# Patient Record
Sex: Female | Born: 1965 | Race: White | Hispanic: No | Marital: Single | Smoking: Never smoker
Health system: Southern US, Community
[De-identification: ages and names within clinical notes are randomized; demographics above are authoritative.]

## PROBLEM LIST (undated history)

## (undated) DIAGNOSIS — M545 Low back pain, unspecified: Secondary | ICD-10-CM

## (undated) DIAGNOSIS — F419 Anxiety disorder, unspecified: Secondary | ICD-10-CM

## (undated) DIAGNOSIS — E039 Hypothyroidism, unspecified: Secondary | ICD-10-CM

---

## 1998-06-28 ENCOUNTER — Ambulatory Visit (HOSPITAL_COMMUNITY): Admission: RE | Admit: 1998-06-28 | Discharge: 1998-06-28 | Payer: Self-pay | Admitting: Obstetrics and Gynecology

## 2004-04-20 ENCOUNTER — Ambulatory Visit (HOSPITAL_COMMUNITY): Admission: RE | Admit: 2004-04-20 | Discharge: 2004-04-20 | Payer: Self-pay | Admitting: Gastroenterology

## 2004-05-21 ENCOUNTER — Encounter (INDEPENDENT_AMBULATORY_CARE_PROVIDER_SITE_OTHER): Payer: Self-pay | Admitting: Specialist

## 2004-05-21 ENCOUNTER — Ambulatory Visit (HOSPITAL_COMMUNITY): Admission: RE | Admit: 2004-05-21 | Discharge: 2004-05-22 | Payer: Self-pay | Admitting: General Surgery

## 2005-08-05 ENCOUNTER — Encounter (INDEPENDENT_AMBULATORY_CARE_PROVIDER_SITE_OTHER): Payer: Self-pay | Admitting: *Deleted

## 2005-08-05 ENCOUNTER — Ambulatory Visit (HOSPITAL_BASED_OUTPATIENT_CLINIC_OR_DEPARTMENT_OTHER): Admission: RE | Admit: 2005-08-05 | Discharge: 2005-08-05 | Payer: Self-pay | Admitting: Obstetrics and Gynecology

## 2005-09-17 IMAGING — RF DG CHOLANGIOGRAM OPERATIVE
1 series · 4 of 4 positions shown · IV contrast (agent unspecified)
Comparison: none

CLINICAL DATA: Laparoscopic cholecystectomy.
 OPERATIVE CHOLANGIOGRAM:
 Multiple C-arm exposures show cannulation of the cystic duct remnant with contrast injection that opacifies in normal proximal intrahepatic biliary system, common hepatic duct, and common bile duct.  No filling defects or obstruction.  Contrast flows freely into the duodenum.

[Series 1: run · 4 of 79 frames shown]
[frame 12/79]
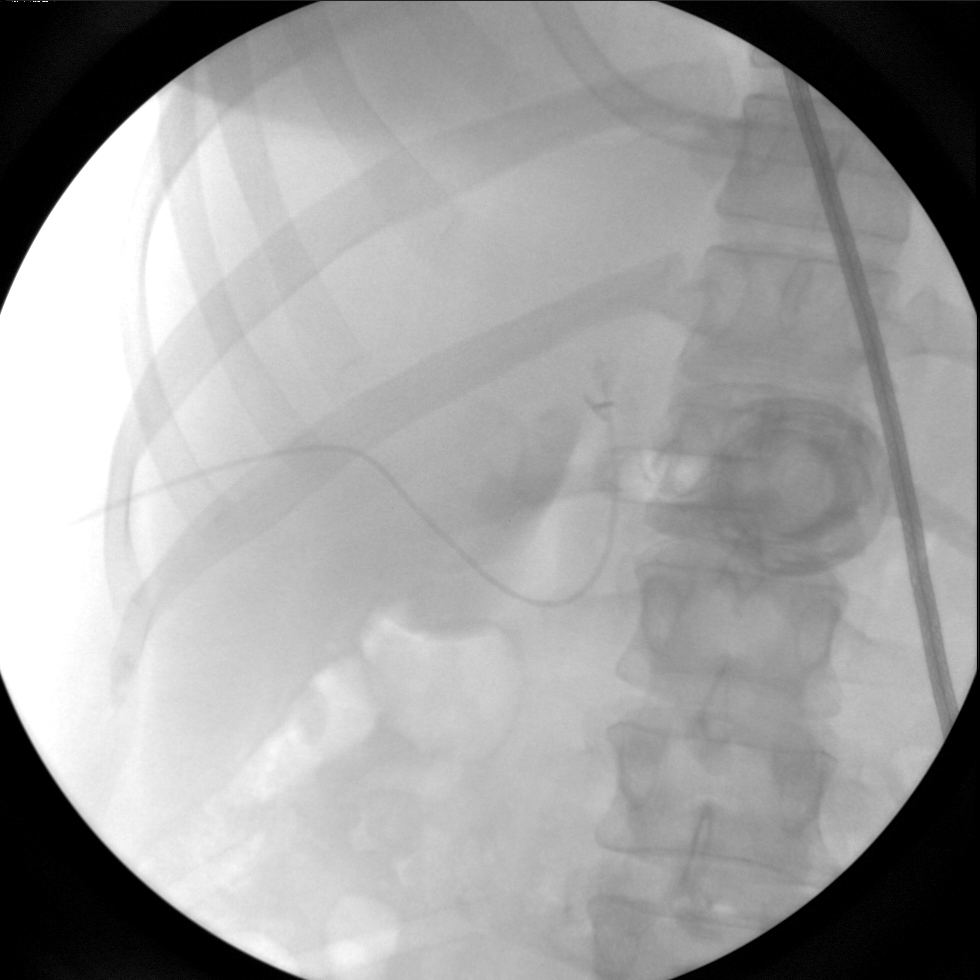
[frame 40/79]
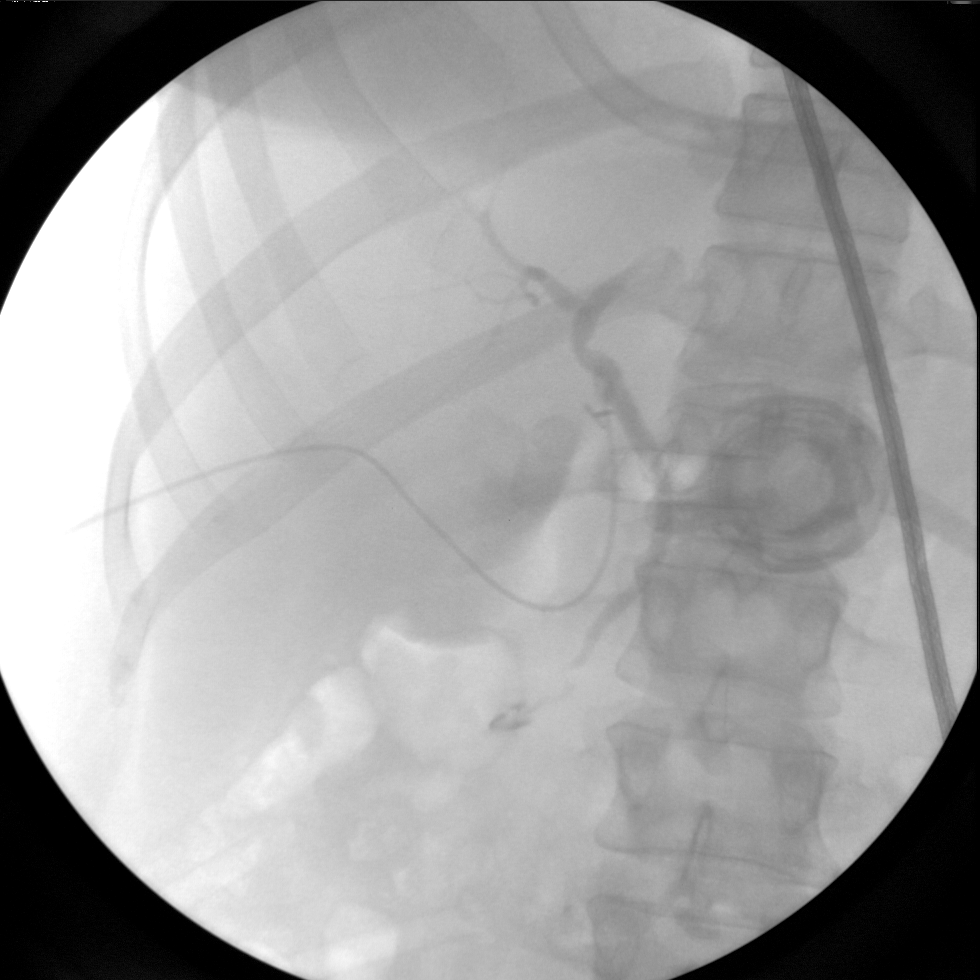
[frame 68/79]
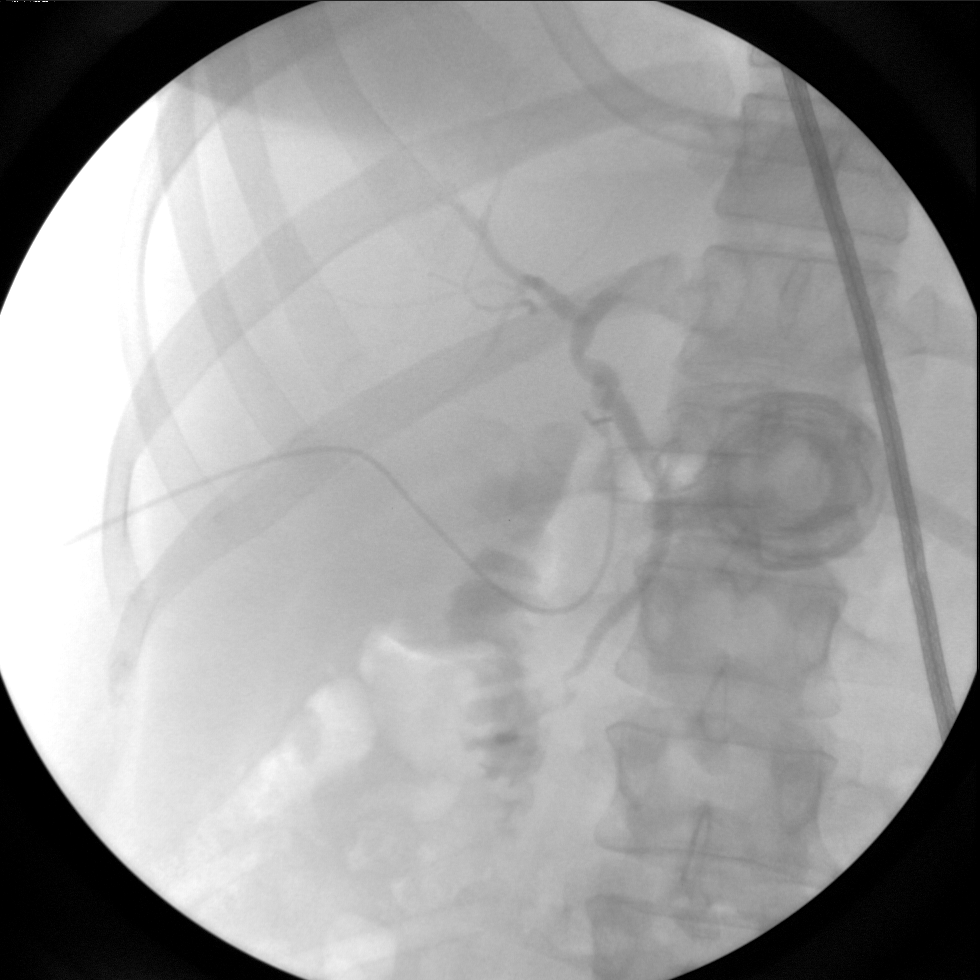
[frame 79/79]
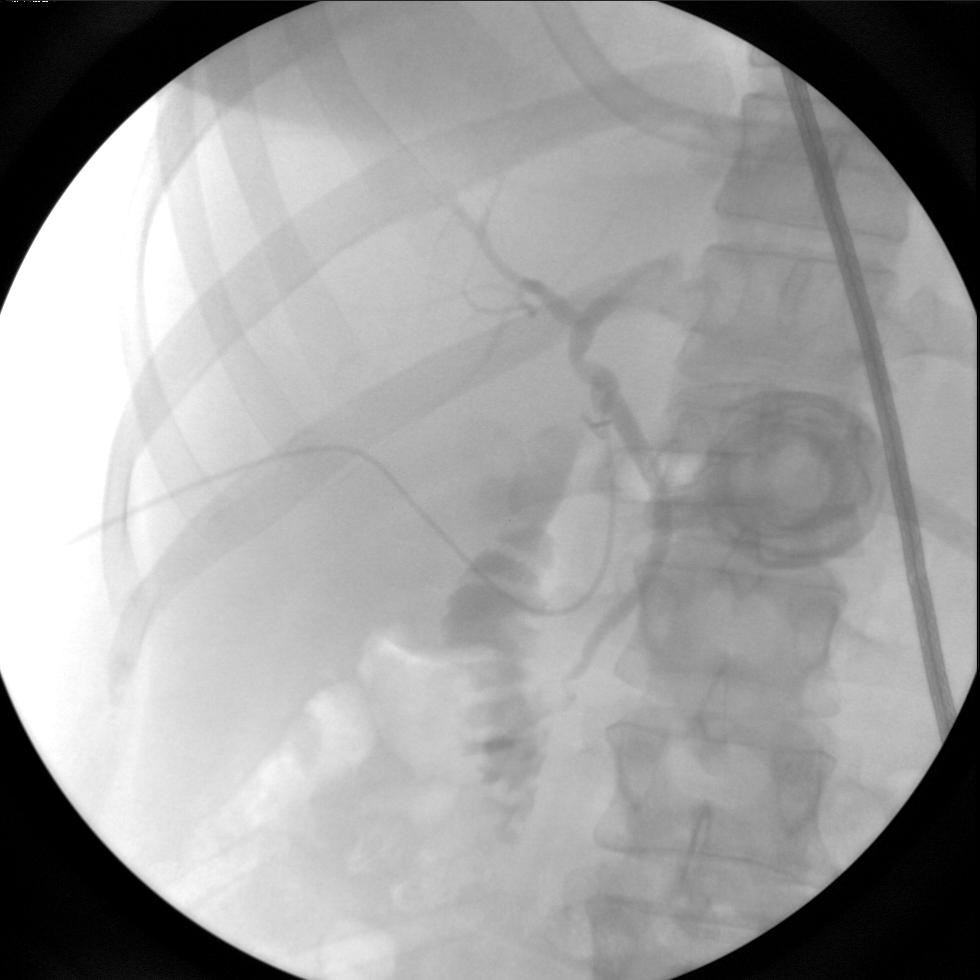

[4 of 4 positions shown; findings below may reference images not displayed]

IMPRESSION: Normal operative cholangiogram.

## 2010-02-24 ENCOUNTER — Emergency Department (HOSPITAL_COMMUNITY): Admission: EM | Admit: 2010-02-24 | Discharge: 2010-02-24 | Payer: Self-pay | Admitting: Emergency Medicine

## 2010-10-05 LAB — DIFFERENTIAL
Basophils Absolute: 0.1 10*3/uL (ref 0.0–0.1)
Basophils Relative: 1 % (ref 0–1)
Eosinophils Absolute: 0.4 10*3/uL (ref 0.0–0.7)
Eosinophils Relative: 4 % (ref 0–5)
Lymphocytes Relative: 30 % (ref 12–46)
Lymphs Abs: 2.8 10*3/uL (ref 0.7–4.0)
Monocytes Absolute: 0.8 10*3/uL (ref 0.1–1.0)
Monocytes Relative: 8 % (ref 3–12)
Neutro Abs: 5.3 10*3/uL (ref 1.7–7.7)
Neutrophils Relative %: 57 % (ref 43–77)

## 2010-10-05 LAB — CBC
HCT: 37 % (ref 36.0–46.0)
Hemoglobin: 12.4 g/dL (ref 12.0–15.0)
MCH: 27.4 pg (ref 26.0–34.0)
MCHC: 33.5 g/dL (ref 30.0–36.0)
MCV: 81.9 fL (ref 78.0–100.0)
Platelets: 240 10*3/uL (ref 150–400)
RBC: 4.52 MIL/uL (ref 3.87–5.11)
RDW: 15.3 % (ref 11.5–15.5)
WBC: 9.3 10*3/uL (ref 4.0–10.5)

## 2010-10-05 LAB — POCT I-STAT, CHEM 8
BUN: 13 mg/dL (ref 6–23)
Calcium, Ion: 1.13 mmol/L (ref 1.12–1.32)
Chloride: 104 mEq/L (ref 96–112)
Creatinine, Ser: 1.1 mg/dL (ref 0.4–1.2)
Glucose, Bld: 84 mg/dL (ref 70–99)
HCT: 38 % (ref 36.0–46.0)
Hemoglobin: 12.9 g/dL (ref 12.0–15.0)
Potassium: 3.9 mEq/L (ref 3.5–5.1)
Sodium: 139 mEq/L (ref 135–145)
TCO2: 26 mmol/L (ref 0–100)

## 2010-10-05 LAB — BASIC METABOLIC PANEL
BUN: 12 mg/dL (ref 6–23)
CO2: 27 mEq/L (ref 19–32)
Calcium: 9.4 mg/dL (ref 8.4–10.5)
Chloride: 106 mEq/L (ref 96–112)
Creatinine, Ser: 0.94 mg/dL (ref 0.4–1.2)
GFR calc Af Amer: >60 mL/min (ref >60)
GFR calc non Af Amer: >60 mL/min (ref >60)
Glucose, Bld: 85 mg/dL (ref 70–99)
Potassium: 3.9 mEq/L (ref 3.5–5.1)
Sodium: 140 mEq/L (ref 135–145)

## 2010-10-05 LAB — POCT CARDIAC MARKERS
CKMB, poc: 1.1 ng/mL (ref 1.0–8.0)
Myoglobin, poc: 85.6 ng/mL (ref 12–200)
Troponin i, poc: <0.05 ng/mL (ref 0.00–0.09)

## 2010-12-07 NOTE — Op Note (Signed)
Claudia Andrade, Claudia Andrade                  ACCOUNT NO.:  [id number]   MEDICAL RECORD NO.:  [id number]          PATIENT TYPE:  AMB   LOCATION:  [facility]                         FACILITY:  [facility]   PHYSICIAN:  Cynthia P. Romine, M.D.DATE OF BIRTH:  1965-11-01   DATE OF PROCEDURE:  08/05/2005  DATE OF DISCHARGE:                                 OPERATIVE REPORT   PREOPERATIVE DIAGNOSIS:  3 cm vaginal inclusion cyst.   POSTOPERATIVE DIAGNOSIS:  3 cm vaginal inclusion cyst. Path pending,  possible fibroid.   SURGEON:  Cynthia P. Romine, M.D.   ANESTHESIA:  General by LMA.   ESTIMATED BLOOD LOSS:  100 mL.   COMPLICATIONS:  None.   DESCRIPTION OF PROCEDURE:  The patient was taken to the operating room and  after the induction of adequate general anesthesia was prepped and draped in  the usual fashion. The vaginal mucosa over this 3 cm mass was incised with a  knife. This mass was directly on the posterior wall at the introitus and was  very mobile. It was definitely separate from the rectum and it would pull  almost out of the introitus so that for the dissection, the surgeon could  operate essentially outside the introitus. After incising the vaginal  mucosa, the vaginal mucosa was grasped with Kelly's and sharp dissection was  used to shell out this cyst. It shelled out very much the way a fibroid  shells out of the uterus. It was removed and it was opened and the body of  the cyst was firm and white, although it did not whorled. The base of the  wound where the cyst had been was closed with 2-0 chromic for hemostasis.  The vaginal mucosa was then reapproximated also with 2-0 chromic. Good  hemostasis was achieved and the procedure was terminated. The patient was  taken to the recovery room in satisfactory condition.           ______________________________  Edwena Felty. Romine, M.D.     CPR/MEDQ  D:  08/05/2005  T:  08/05/2005  Job:  [id number]

## 2010-12-07 NOTE — Op Note (Signed)
Claudia Andrade, Claudia Andrade                  ACCOUNT NO.:  [id number]   MEDICAL RECORD NO.:  [id number]          PATIENT TYPE:  OIB   LOCATION:  5703                         FACILITY:  [facility]   PHYSICIAN:  Adolph Pollack, M.D.DATE OF BIRTH:  05-Dec-1965   DATE OF PROCEDURE:  05/21/2004  DATE OF DISCHARGE:                                 OPERATIVE REPORT   PREOPERATIVE DIAGNOSIS:  Symptomatic cholelithiasis.   POSTOPERATIVE DIAGNOSIS:  Chronic cholecystitis with cholelithiasis.   PROCEDURE:  Laparoscopic cholecystectomy with interoperative cholangiogram.   SURGEON:  Adolph Pollack, M.D.   ASSISTANT:  Leonie Man, M.D.   ANESTHESIA:  General.   INDICATIONS FOR PROCEDURE:  Ms. Balin is a 45 year old female who has been  having some cramping type right upper quadrant pain associated with nausea  following a meal.  She had two other episodes since that time.  Ultrasound  demonstrated cholelithiasis and multiple gallstones.  The common bile duct  diameter is normal.  Liver function is normal.  She now presents for  elective cholecystectomy.   SURGICAL TECHNIQUE:  She was seen in the holding area and then brought to  the operating room and placed supine on the operating table and a general  anesthetic was administered.  Her abdominal wall was sterilely prepped and  draped.  Dilute Marcaine solution was infiltrated into the subumbilical  region and a small subumbilical incision was made through the skin and  subcutaneous tissue, midline fascia, and peritoneum entering the peritoneal  cavity under direct vision.  A purse-string suture of 0 Vicryl was placed  around the fascial edges.  A Hasson trocar was introduced into the  peritoneal cavity and pneumoperitoneum was created by insufflation of CO2  gas.  The laparoscope was then introduced.  She was then placed in reversed  Trendelenburg position with the right side tilted slightly up.  Under direct  vision, an 11 mm trocar was placed  through an epigastric incision and two 5  mm trocars were placed in the right and mid lateral abdomen.  The  gallbladder was slightly pale in color consistent with some chronic  inflammatory changes.  The fundus of the gallbladder was grasped and  retracted to the right shoulder.  The infundibulum was grasped and using  careful blunt dissection staying on the gallbladder, I mobilized the  infundibulum.  I then identified the cystic duct and the cystic duct  gallbladder junction.  The cystic duct was isolated and a window was created  around it.  A clip was then placed just above the cystic duct gallbladder  junction.  I made a small incision at the cystic duct gallbladder junction  and bile was milked back.  A cholangiogram catheter was passed through the  anterior abdominal wall and placed in the cystic duct and cholangiogram was  performed.  Under real time fluoroscopy, dilute contrast material was  injected into the cystic duct.  The cystic duct was a short to moderate  length.  The common hepatic, right and left hepatic, and common bile ducts  all opacified promptly and contrast drained into the  common bile duct  promptly without obvious evidence of obstruction.  The final report is  pending the radiologist interpretation.  The cholangiogram catheter was then  removed, the cystic duct was clipped three proximally and divided.  Staying  close to the gallbladder, I isolated both an anterior and posterior branch  of the cystic artery, clipped these, and divided them.  The gallbladder was  dissected free from the liver bed using electrocautery staying on the  gallbladder.  A small puncture wound was made in the gallbladder and some  bile spilled out but no obvious stones were spilled.  The gallbladder was  then placed in the endopouch bag.  I then copiously irrigated out the  gallbladder fossa in the perihepatic area.  Bleeding points were controlled  with cautery.  The fluid was then  evacuated.  The gallbladder fossa was  inspected once again and no bile leak or bleeding was noted.  I then  evacuated the rest of the irrigation fluid and irrigated until it was clear.  Once it was clear, I then removed the gallbladder through the subumbilical  port in the endopouch bag.  I then subumbilical fascial defect under  laparoscopic vision by tightening up and tying down the purse-string suture.  The remaining trocars were removed and the pneumoperitoneum was released.  The skin incisions were closed with 4-0 Monocryl subcuticular stitches  followed by Steri-Strips and sterile dressings.  She tolerated the procedure  well without any apparent complication and was taken to the recovery room in  satisfactory condition.       TJR/MEDQ  D:  05/21/2004  T:  05/21/2004  Job:  [id number]   cc:   Anselmo Rod, M.D.  [address]  [address]  [place]  [place] [address]  Fax: [phone]   Gloriajean Dell. Andrey Campanile, M.D.  [address]  [place]  [place] [address]  Fax: [phone]

## 2011-06-23 IMAGING — CR DG CHEST 2V
2 series · 2 of 2 positions shown · non-contrast
Comparison: None.

CLINICAL DATA: 44-year-old female with substernal chest pain.

CHEST - 2 VIEW

[w chest pa *]
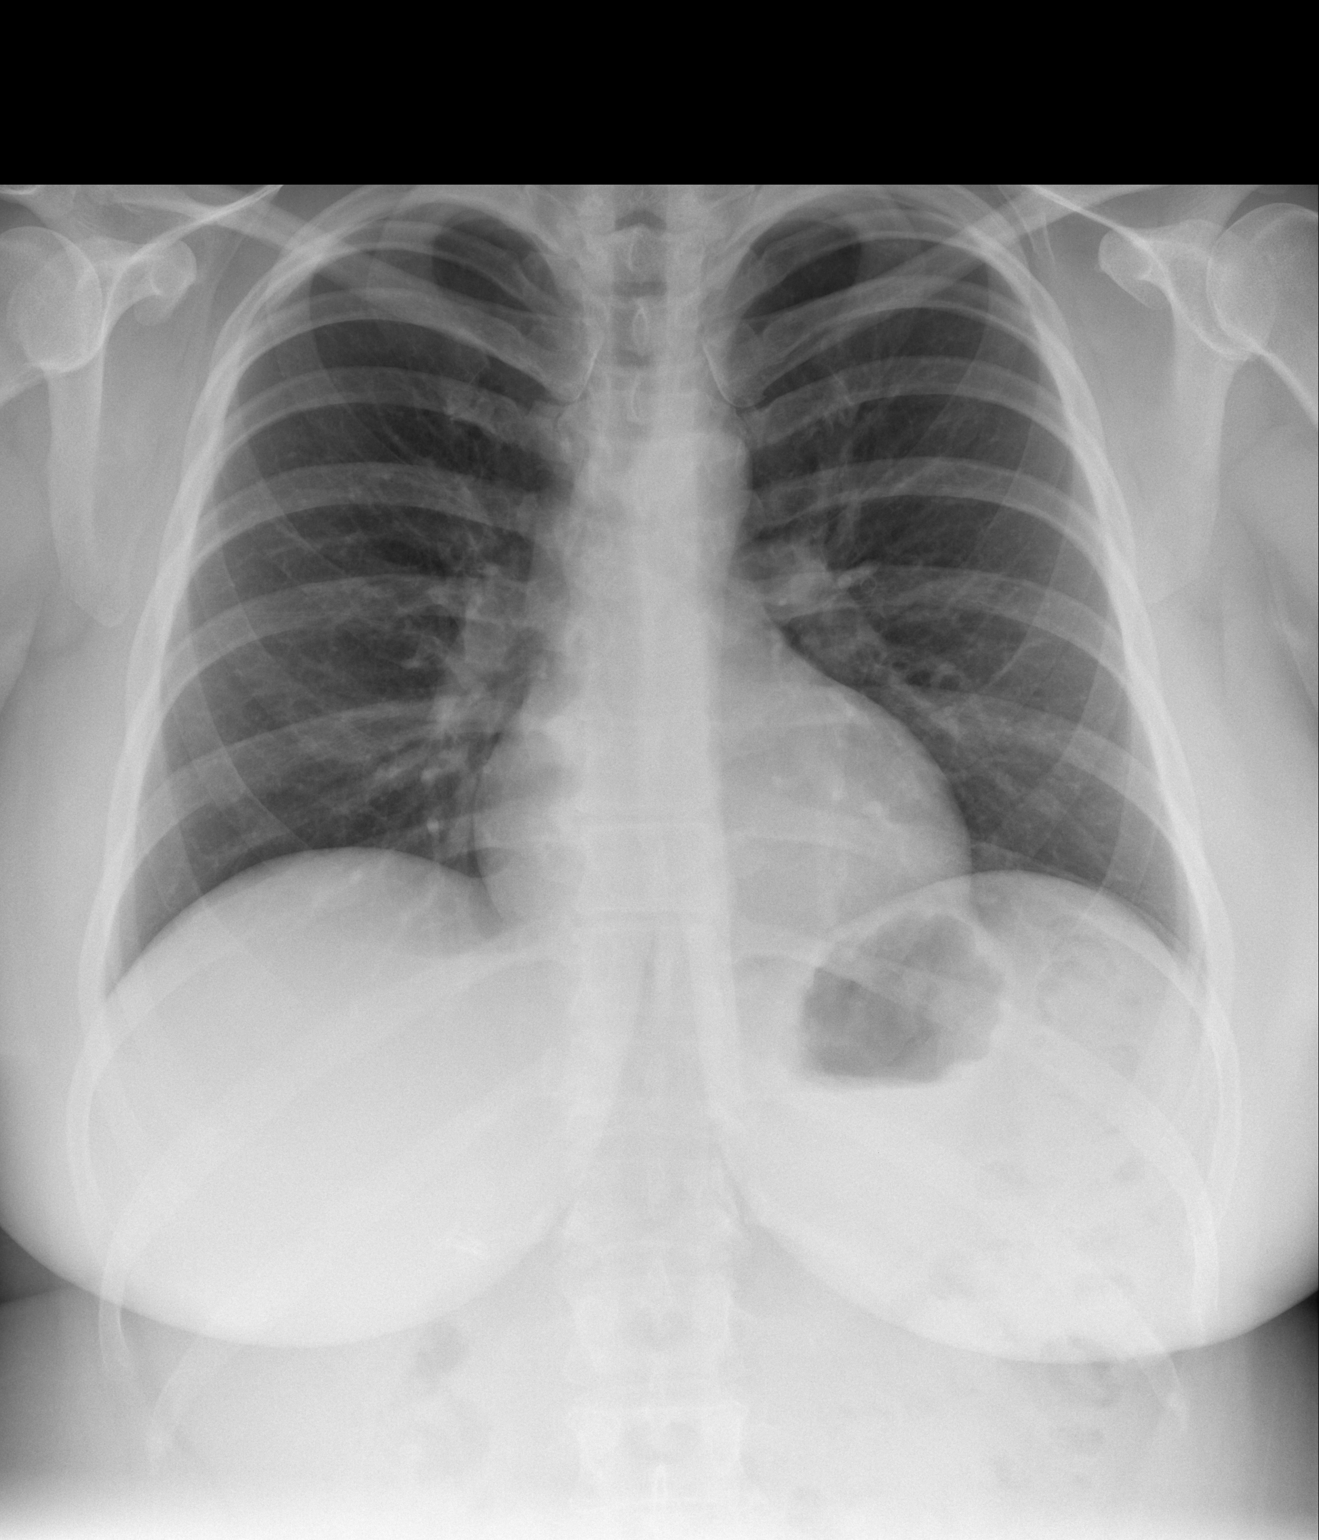

[w chest lat *]
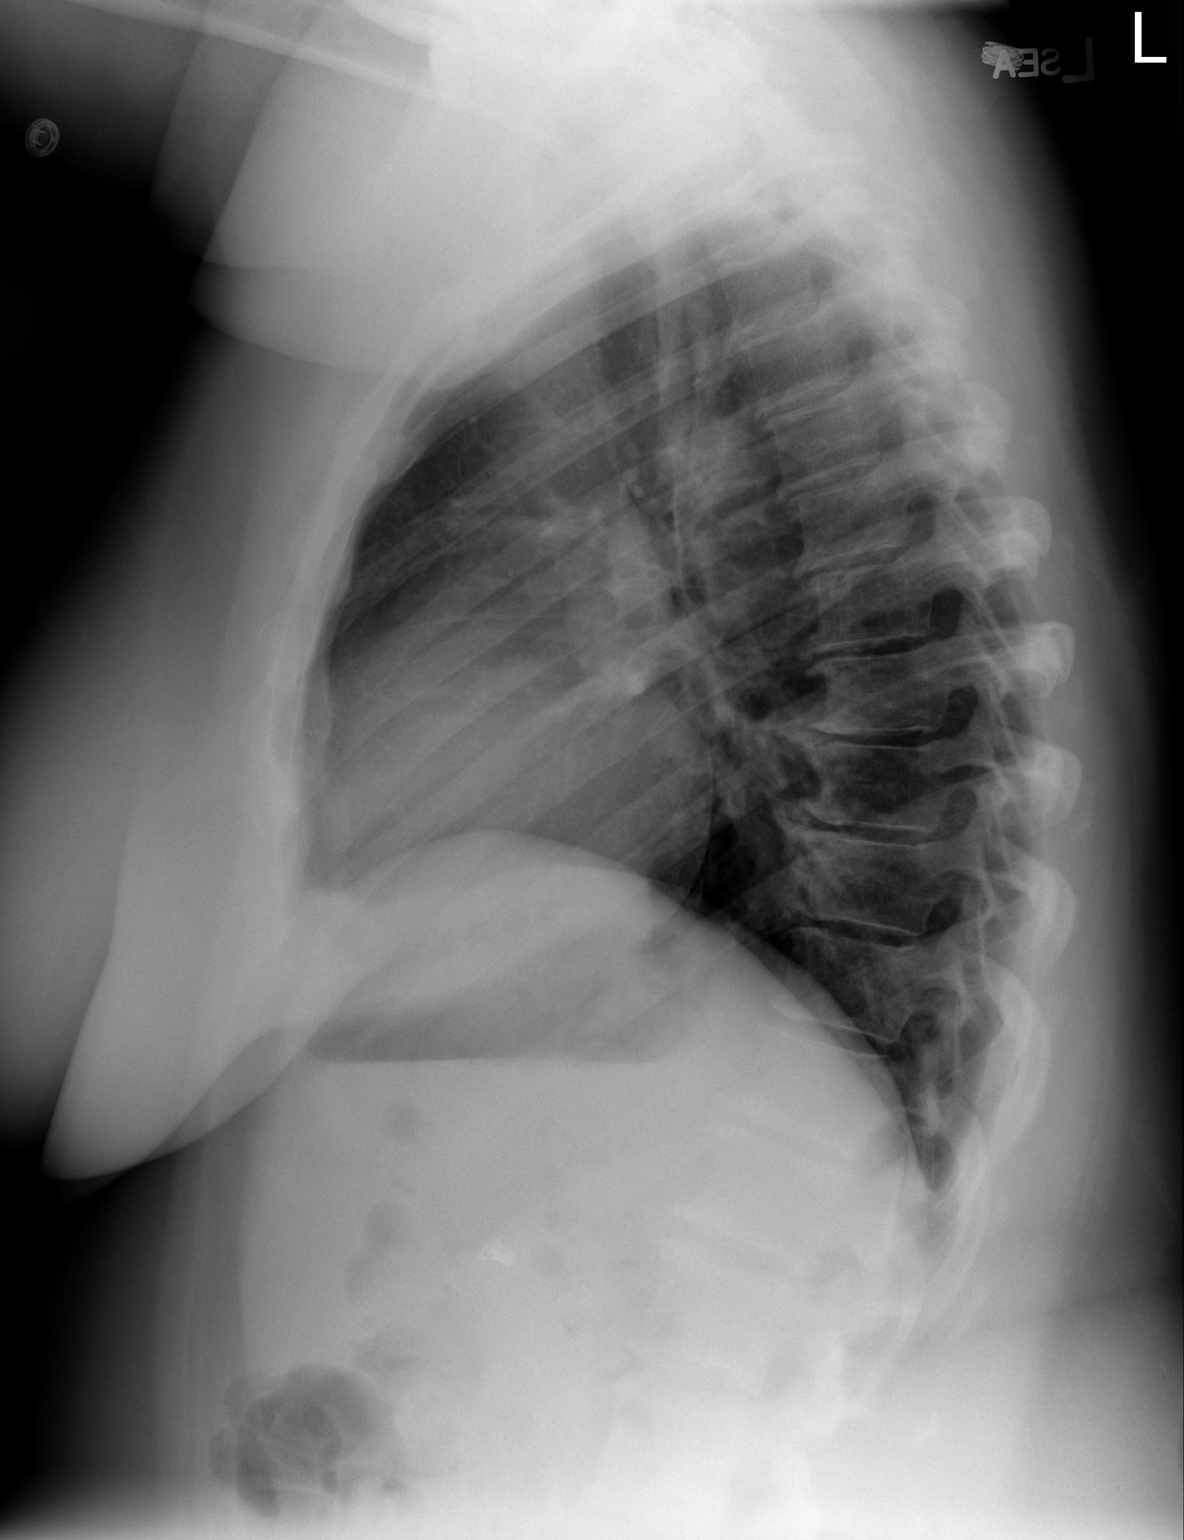

[2 of 2 positions shown; findings below may reference images not displayed]

FINDINGS: Low lung volumes. Normal cardiac size and mediastinal
contours.  Visualized tracheal air column is within normal limits.
The lungs are clear.  No pneumothorax.  Surgical clips in the right
upper quadrant. No acute osseous abnormality identified.
IMPRESSION: Negative, no acute cardiopulmonary abnormality.

## 2013-11-14 ENCOUNTER — Encounter (HOSPITAL_BASED_OUTPATIENT_CLINIC_OR_DEPARTMENT_OTHER): Payer: Self-pay | Admitting: Emergency Medicine

## 2013-11-14 ENCOUNTER — Emergency Department (HOSPITAL_BASED_OUTPATIENT_CLINIC_OR_DEPARTMENT_OTHER)
Admission: EM | Admit: 2013-11-14 | Discharge: 2013-11-14 | Disposition: A | Discharge status: Home / Self Care | Payer: Managed Care, Other (non HMO) | Attending: Emergency Medicine | Admitting: Emergency Medicine

## 2013-11-14 DIAGNOSIS — E039 Hypothyroidism, unspecified: Secondary | ICD-10-CM | POA: Insufficient documentation

## 2013-11-14 DIAGNOSIS — G5600 Carpal tunnel syndrome, unspecified upper limb: Secondary | ICD-10-CM | POA: Insufficient documentation

## 2013-11-14 DIAGNOSIS — Z79899 Other long term (current) drug therapy: Secondary | ICD-10-CM | POA: Insufficient documentation

## 2013-11-14 DIAGNOSIS — Z8739 Personal history of other diseases of the musculoskeletal system and connective tissue: Secondary | ICD-10-CM | POA: Insufficient documentation

## 2013-11-14 DIAGNOSIS — Z791 Long term (current) use of non-steroidal anti-inflammatories (NSAID): Secondary | ICD-10-CM | POA: Insufficient documentation

## 2013-11-14 DIAGNOSIS — F411 Generalized anxiety disorder: Secondary | ICD-10-CM | POA: Insufficient documentation

## 2013-11-14 DIAGNOSIS — N644 Mastodynia: Secondary | ICD-10-CM | POA: Insufficient documentation

## 2013-11-14 HISTORY — DX: Anxiety disorder, unspecified: F41.9

## 2013-11-14 HISTORY — DX: Hypothyroidism, unspecified: E03.9

## 2013-11-14 HISTORY — DX: Low back pain, unspecified: M54.50

## 2013-11-14 HISTORY — DX: Low back pain: M54.5

## 2013-11-14 NOTE — ED Provider Notes (Signed)
CSN: [id number]     Arrival date & time 11/14/13  0825 History   First MD Initiated Contact with Patient 11/14/13 [id number]     Chief Complaint  Patient presents with  . Numbness  . Breast Pain     HPI  Presents with tingling in her left hand and forearm. She's noticed it for about a week. It is a tingling described as pins and needles. He does breath am, index, middle finger. 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 There is a consistently for the last 3 days. States at times will make her feel a tingling into her anterior axillary fold but not into her chest. He usually gets back pain. Follows with her primary care physician for this and been told it is muscular. She has no cardiac risk factors. No history of hypertension diabetes or smoking. No chest pain no difficulty breathing nausea or other symptoms.  Past Medical History  Diagnosis Date  . Hypothyroid   . Anxiety   . Lower back pain    No past surgical history on file. No family history on file. History  Substance Use Topics  . Smoking status: Never Smoker   . Smokeless tobacco: Not on file  . Alcohol Use: Not on file   OB History   Grav Para Term Preterm Abortions TAB SAB Ect Mult Living                 Review of Systems  Constitutional: Negative for fever, chills, diaphoresis, appetite change and fatigue.  HENT: Negative for mouth sores, sore throat and trouble swallowing.   Eyes: Negative for visual disturbance.  Respiratory: Negative for cough, chest tightness, shortness of breath and wheezing.   Cardiovascular: Negative for chest pain.  Gastrointestinal: Negative for nausea, vomiting, abdominal pain, diarrhea and abdominal distention.  Endocrine: Negative for polydipsia, polyphagia and polyuria.  Genitourinary: Negative for dysuria, frequency and hematuria.  Musculoskeletal: Negative for gait problem.       Tingling from  the first 3 digits to the mid forearm  Skin: Negative for color change, pallor and rash.  Neurological: Negative for dizziness, syncope, light-headedness and headaches.  Hematological: Does not bruise/bleed easily.  Psychiatric/Behavioral: Negative for behavioral problems and confusion.      Allergies  Review of patient's allergies indicates no known allergies.  Home Medications   Prior to Admission medications   Medication Sig Start Date End Date Taking? Authorizing Provider  citalopram (CELEXA) 40 MG tablet Take 40 mg by mouth daily.   Yes Historical Provider, MD  levothyroxine (SYNTHROID, LEVOTHROID) 25 MCG tablet Take 25 mcg by mouth daily before breakfast.   Yes Historical Provider, MD  meloxicam (MOBIC) 15 MG tablet Take 15 mg by mouth daily.   Yes Historical Provider, MD   BP 128/61  Pulse 64  Temp(Src) 98.1 F (36.7 C) (Oral)  Resp 16  Wt 200 lb (90.719 kg)  SpO2 99% Physical Exam  Constitutional: She is oriented to person, place, and time. She appears well-developed and well-nourished. No distress.  HENT:  Head: Normocephalic.  Eyes: Conjunctivae are normal. Pupils are equal, round, and reactive to light. No scleral icterus.  Neck: Normal range of motion. Neck supple. No thyromegaly present.  Cardiovascular: Normal rate and regular rhythm.  Exam reveals no gallop and no friction rub.   No murmur heard. Pulmonary/Chest: Effort normal and breath sounds normal. No respiratory distress. She  has no wheezes. She has no rales.  Abdominal: Soft. Bowel sounds are normal. She exhibits no distension. There is no tenderness. There is no rebound.  Musculoskeletal: Normal range of motion.       Arms: Neurological: She is alert and oriented to person, place, and time.  Skin: Skin is warm and dry. No rash noted.  Psychiatric: She has a normal mood and affect. Her behavior is normal.    ED Course  Procedures (including critical care time) Labs Review Labs Reviewed - No data to  display  Imaging Review No results found.   EKG Interpretation None      MDM   Final diagnoses:  Carpal tunnel syndrome    EKG. Symptoms do not sound cardiac whatsoever. She does repetitive motion with take occurring once a week does have symptoms after work or in the morning. Normal neurological exam. No symptoms to suggest radiculopathy. No neck pain. I will be rest splinting continuing anti-inflammatory.    Rolland PorterMark Loanne Emery, MD 11/14/13 1020

## 2013-11-14 NOTE — ED Notes (Addendum)
Pt states she started to have left arm numbness/tingling since Friday mid morning.  No known injury.  Pt states she has had intermittent pain between her shoulder blades.  No sob, no chest pain, no neuro deficits noted.  Pt having left upper breast pain since Friday.  Some diaphoresis for 6 months.

## 2013-11-14 NOTE — Discharge Instructions (Signed)
Wear the splint at night. Or your splint at work if possible. Do your anti-inflammatory. Apply ice to your wrist or area of tenderness as needed  Carpal Tunnel Syndrome The carpal tunnel is an area under the skin of the palm of your hand. Nerves, blood vessels, and strong tissues (tendons) pass through the tunnel. The tunnel can become puffy (swollen). If this happens, a nerve can be pinched in the wrist. This causes carpal tunnel syndrome.  HOME CARE  Take all medicine as told by your doctor.  If you were given a splint, wear it as told. Wear it at night or at times when your doctor told you to.  Rest your wrist from the activity that causes your pain.  Put ice on your wrist after long periods of wrist activity.  Put ice in a plastic bag.  Place a towel between your skin and the bag.  Leave the ice on for 15-20 minutes, 03-04 times a day.  Keep all doctor visits as told. GET HELP RIGHT AWAY IF:  You have new problems you cannot explain.  Your problems get worse and medicine does not help. MAKE SURE YOU:   Understand these instructions.  Will watch your condition.  Will get help right away if you are not doing well or get worse. Document Released: 06/27/2011 Document Revised: 09/30/2011 Document Reviewed: 06/27/2011 [facility] Patient Information 2014 [place], [place].

## 2014-07-19 ENCOUNTER — Ambulatory Visit (INDEPENDENT_AMBULATORY_CARE_PROVIDER_SITE_OTHER): Payer: Managed Care, Other (non HMO)

## 2014-07-19 VITALS — BP 110/70 | HR 79 | Resp 12

## 2014-07-19 DIAGNOSIS — M7730 Calcaneal spur, unspecified foot: Secondary | ICD-10-CM

## 2014-07-19 DIAGNOSIS — R52 Pain, unspecified: Secondary | ICD-10-CM

## 2014-07-19 DIAGNOSIS — M7662 Achilles tendinitis, left leg: Secondary | ICD-10-CM

## 2014-07-19 DIAGNOSIS — M79673 Pain in unspecified foot: Secondary | ICD-10-CM

## 2014-07-19 DIAGNOSIS — M7752 Other enthesopathy of left foot: Secondary | ICD-10-CM

## 2014-07-19 NOTE — Progress Notes (Signed)
   Subjective:    Patient ID: Claudia Andrade, female    DOB: 10/10/1965, 48 y.o.   MRN: [id number]  HPI  PT STATED SEEN DR. TOM 6 MONTHS AGO AND DIAGNOSED WITH HEEL SPUR AND BEEN HURTING FOR 1 YEARS. THE FOOT IS GETTING WORSE BUT SOME DAYS IS BETTER. THE FOOT GET WORSE AND HAVE SHARP PAIN. DR. Elijah BirkM PRESCRIBE CORTISONE SHOT BUT NO HELP.  Review of Systems  Cardiovascular:       Calf pain.  Musculoskeletal: Positive for myalgias, back pain, joint swelling and gait problem.  Neurological: Positive for numbness and headaches.  All other systems reviewed and are negative.      Objective:   Physical Exam 48 year old white female well-developed well-nourished oriented 3 presents this time a referral from Dr. Elijah Birkom is retrocalcaneal spurring and thickening the Achilles tendon insertion which is significant as compared to the right left heel has prominence of the posterior heel posterior Achilles tendon insertion and above the insertion tenderness on palpation range of motion and ambulation. X-rays reveal calcification within the Achilles tendon thickening the tendon body at its insertion more than twice its normal thickness there is well-developed inferior as well as significant retrocalcaneal spurs and osteophytes noted radiographically and clinically large edema and induration of the posterior heel area. Patient's had injections in the mobilization air fracture boot NSAID therapies stretching exercises ice all providing little or temporary relief. 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 Plan at this time is for review of consent form for retrocalcaneal spur resection into the lysis Achilles tendon left consent form was reviewed and signed with discontinue NSAIDs release Tylenol as needed for pain ice patient will be in air fracture boot for at least a 8-10 week duration needs a leaf or leave of absence from work for 10-12 weeks postoperatively. Will be immobilized or nonweightbearing for at least 6 weeks cane use either a walker crutches or rollabout knee walker for ambulation and transport. All questions asked by the patient are answered there no current medications and surgery scheduled at this time are     Alvan Dameichard Antione Obar DPM

## 2014-07-19 NOTE — Patient Instructions (Signed)
Pre-Operative Instructions  Congratulations, you have decided to take an important step to improving your quality of life.  You can be assured that the doctors of [facility] will be with you every step of the way.  1. Plan to be at the surgery center/hospital at least 1 (one) hour prior to your scheduled time unless otherwise directed by the surgical center/hospital staff.  You must have a responsible adult accompany you, remain during the surgery and drive you home.  Make sure you have directions to the surgical center/hospital and know how to get there on time. 2. For hospital based surgery you will need to obtain a history and physical form from your family physician within 1 month prior to the date of surgery- we will give you a form for you primary physician.  3. We make every effort to accommodate the date you request for surgery.  There are however, times where surgery dates or times have to be moved.  We will contact you as soon as possible if a change in schedule is required.   4. No Aspirin/Ibuprofen for one week before surgery.  If you are on aspirin, any non-steroidal anti-inflammatory medications (Mobic, Aleve, Ibuprofen) you should stop taking it 7 days prior to your surgery.  You make take Tylenol  For pain prior to surgery.  5. Medications- If you are taking daily heart and blood pressure medications, seizure, reflux, allergy, asthma, anxiety, pain or diabetes medications, make sure the surgery center/hospital is aware before the day of surgery so they may notify you which medications to take or avoid the day of surgery. 6. No food or drink after midnight the night before surgery unless directed otherwise by surgical center/hospital staff. 7. No alcoholic beverages 24 hours prior to surgery.  No smoking 24 hours prior to or 24 hours after surgery. 8. Wear loose pants or shorts- loose enough to fit over bandages, boots, and casts. 9. No slip on shoes, sneakers are best. 10. Bring  your boot with you to the surgery center/hospital.  Also bring crutches or a walker if your physician has prescribed it for you.  If you do not have this equipment, it will be provided for you after surgery. 11. If you have not been contracted by the surgery center/hospital by the day before your surgery, call to confirm the date and time of your surgery. 12. Leave-time from work may vary depending on the type of surgery you have.  Appropriate arrangements should be made prior to surgery with your employer. 13. Prescriptions will be provided immediately following surgery by your doctor.  Have these filled as soon as possible after surgery and take the medication as directed. 14. Remove nail polish on the operative foot. 15. Wash the night before surgery.  The night before surgery wash the foot and leg well with the antibacterial soap provided and water paying special attention to beneath the toenails and in between the toes.  Rinse thoroughly with water and dry well with a towel.  Perform this wash unless told not to do so by your physician.  Enclosed: 1 Ice pack (please put in freezer the night before surgery)   1 Hibiclens skin cleaner   Pre-op Instructions  If you have any questions regarding the instructions, do not hesitate to call our office.  [place]: [address] [place], [place] [address] [phone]  [place]: [address] [place], [place] [address] [phone]  [place]: [address]  [place], [place] [address] [phone]  Dr. Arlander Gillen   Tuchman DPM, Dr. Norman Regal DPM Dr. Ramon Brant DPM, Dr. M. Todd Hyatt DPM, Dr. Kathryn Egerton DPM 

## 2014-07-29 ENCOUNTER — Telehealth: Payer: Self-pay | Admitting: *Deleted

## 2014-07-29 NOTE — Telephone Encounter (Signed)
Pt request information concerning her FMLA.

## 2014-08-17 DIAGNOSIS — M25775 Osteophyte, left foot: Secondary | ICD-10-CM

## 2014-08-17 DIAGNOSIS — M65879 Other synovitis and tenosynovitis, unspecified ankle and foot: Secondary | ICD-10-CM

## 2014-08-18 ENCOUNTER — Encounter: Payer: Self-pay | Admitting: *Deleted

## 2014-08-18 ENCOUNTER — Telehealth: Payer: Self-pay | Admitting: *Deleted

## 2014-08-18 MED ORDER — MEPERIDINE HCL 50 MG PO TABS: 50.0000 mg | ORAL_TABLET | ORAL | Status: AC | PRN

## 2014-08-18 MED ORDER — PROMETHAZINE HCL 25 MG PO TABS: 25.0000 mg | ORAL_TABLET | Freq: Three times a day (TID) | ORAL | Status: AC | PRN

## 2014-08-18 NOTE — Telephone Encounter (Addendum)
Spoke with pt, she states that after surgery 08/17/2014, she was prescribed Oxycodone.  Pt states she has now been up all night itching, she would like different pain medication.  I instructed pt to stop the Oxycodone and begin Benadryl as OTC package instructs and if she should have difficulty breathing or facial swelling she should go to the ER.  Pt agreed.  Dr. Charlsie Merlesegal ordered begin Ibuprofen 200mg  as OTC package instructs and can use between dosing of the Demerol when it the leg block begins to subside, also to take the Phenergan as directed to alleviate nausea, itching and to increase pain relief property of the Demerol.  Dr. Charlsie Merlesegal ordered Demerol 50mg  #30 one tablet every 4 - 6 hours prn pain and Phenergan 25mg  #30 one tablet every 4 - 6 hours prn nausea.  Orders called to pt, pt states understanding.

## 2014-08-23 ENCOUNTER — Ambulatory Visit (INDEPENDENT_AMBULATORY_CARE_PROVIDER_SITE_OTHER): Payer: Managed Care, Other (non HMO)

## 2014-08-23 VITALS — BP 116/74 | HR 82 | Resp 12

## 2014-08-23 DIAGNOSIS — M7662 Achilles tendinitis, left leg: Secondary | ICD-10-CM

## 2014-08-23 DIAGNOSIS — M7752 Other enthesopathy of left foot: Secondary | ICD-10-CM

## 2014-08-23 DIAGNOSIS — M7730 Calcaneal spur, unspecified foot: Secondary | ICD-10-CM

## 2014-08-23 DIAGNOSIS — R52 Pain, unspecified: Secondary | ICD-10-CM

## 2014-08-23 DIAGNOSIS — Z09 Encounter for follow-up examination after completed treatment for conditions other than malignant neoplasm: Secondary | ICD-10-CM

## 2014-08-23 NOTE — Patient Instructions (Signed)
ICE INSTRUCTIONS  Apply ice or cold pack to the affected area at least 3 times a day for 10-15 minutes each time.  You should also use ice after prolonged activity or vigorous exercise.  Do not apply ice longer than 20 minutes at one time.  Always keep a cloth between your skin and the ice pack to prevent burns.  Being consistent and following these instructions will help control your symptoms.  We suggest you purchase a gel ice pack because they are reusable and do bit leak.  Some of them are designed to wrap around the area.  Use the method that works best for you.  Here are some other suggestions for icing.   Use a frozen bag of peas or corn-inexpensive and molds well to your body, usually stays frozen for 10 to 20 minutes.  Wet a towel with cold water and squeeze out the excess until it's damp.  Place in a bag in the freezer for 20 minutes. Then remove and use.  Maintain boot at all times when ambulatory or moving or while sleeping. May remove the boot during the day to apply some lotion to the skin of her leg atelectasis skin breathe periodically  If there is any swelling or aching or throbbing Apply ice pack to the area for 15 minute intervals as needed

## 2014-08-23 NOTE — Progress Notes (Signed)
   Subjective:    Patient ID: Claudia AndaLisa H Andrade, female    DOB: 26-Feb-1966, 49 y.o.   MRN: [id number]  HPI  DOS 08/17/14 TENOLYSIS LT, CALCANEAL OSTECTOMY LT.  Review of Systems no new findings or systemic changes noted     Objective:   Physical Exam Patient presents 6 days status post phenol lysis of the left Achilles tendon retrocalcaneal spur resection minimal pain tenderness or discomfort incision clean dry well coapted mild ecchymosis is noted x-ray revealed adequate resection of bone from the posterior heel as well as intratendinous calcifications pathology report confirms bone spurring and calcifications and arthritic bone. X-rays reveal good position alignment is mild soft tissue swelling consistent with postop course no pain tenderness discomfort no dehiscence no discharge no drainage.       Assessment & Plan:  Assessment good postop progress dry sterile compressive dressing reapplied maintain air fracture boot at all times as instructed patient is nonweightbearing for at least 3 more weeks as instructed reappointed one week for follow-up possible staple or skin staple removal at that time if healing well next  Alvan Dameichard Shalae Belmonte DPM

## 2014-08-30 ENCOUNTER — Ambulatory Visit (INDEPENDENT_AMBULATORY_CARE_PROVIDER_SITE_OTHER): Payer: Managed Care, Other (non HMO)

## 2014-08-30 ENCOUNTER — Ambulatory Visit: Payer: Self-pay

## 2014-08-30 VITALS — BP 125/70 | HR 70 | Resp 12

## 2014-08-30 DIAGNOSIS — M7730 Calcaneal spur, unspecified foot: Secondary | ICD-10-CM

## 2014-08-30 DIAGNOSIS — M7662 Achilles tendinitis, left leg: Secondary | ICD-10-CM

## 2014-08-30 DIAGNOSIS — Z09 Encounter for follow-up examination after completed treatment for conditions other than malignant neoplasm: Secondary | ICD-10-CM

## 2014-08-30 NOTE — Patient Instructions (Signed)

## 2014-08-30 NOTE — Progress Notes (Signed)
   Subjective:    Patient ID: Claudia Andrade, female    DOB: 26-Sep-1965, 49 y.o.   MRN: [id number]  HPI  DOS 08/17/14 TENOLYSIS LT, CALCANEAL OSTECTOMY LT. ''LT FOOT IS DOING OK.''  Review of Systems no new findings or systemic changes noted     Objective:   Physical Exam Patient presents this time 13 day status post retrocalcaneal spur resection into the lysis Achilles tendon of the left foot doing well minimal pain or discomfort incision clean dry well coapted staples skin staples are intact although every other staples removed at this time there still some slight separation I feel that leaving every other staple in place for one more week would be beneficial do not 1 allow this to Open. Surmount attention the posterior heel area incision site. Time presto dressing is reapplied reappoint one week for removal remaining skin staples.       Assessment & Plan:  Assessment good postop progress neurovascular status is intact pedal pulses palpable half the staples are removed at this time reappointed one week for the other half staple removal afterwards may resume normal bathing and hygiene. Maintain air fracture boot nonweightbearing as instructed continue use a rollabout. Next  Alvan Dameichard Dima Mini DPM

## 2014-09-05 NOTE — Progress Notes (Signed)
DOS Left tenolysis, Left calcaneal ostectomy (posteror)

## 2014-09-06 ENCOUNTER — Ambulatory Visit (INDEPENDENT_AMBULATORY_CARE_PROVIDER_SITE_OTHER): Payer: Managed Care, Other (non HMO)

## 2014-09-06 DIAGNOSIS — R609 Edema, unspecified: Secondary | ICD-10-CM

## 2014-09-06 DIAGNOSIS — Z09 Encounter for follow-up examination after completed treatment for conditions other than malignant neoplasm: Secondary | ICD-10-CM

## 2014-09-06 DIAGNOSIS — M7662 Achilles tendinitis, left leg: Secondary | ICD-10-CM

## 2014-09-06 DIAGNOSIS — M7730 Calcaneal spur, unspecified foot: Secondary | ICD-10-CM

## 2014-09-06 NOTE — Patient Instructions (Signed)
ICE INSTRUCTIONS  Apply ice or cold pack to the affected area at least 3 times a day for 10-15 minutes each time.  You should also use ice after prolonged activity or vigorous exercise.  Do not apply ice longer than 20 minutes at one time.  Always keep a cloth between your skin and the ice pack to prevent burns.  Being consistent and following these instructions will help control your symptoms.  We suggest you purchase a gel ice pack because they are reusable and do bit leak.  Some of them are designed to wrap around the area.  Use the method that works best for you.  Here are some other suggestions for icing.   Use a frozen bag of peas or corn-inexpensive and molds well to your body, usually stays frozen for 10 to 20 minutes.  Wet a towel with cold water and squeeze out the excess until it's damp.  Place in a bag in the freezer for 20 minutes. Then remove and use.  May resume normal bathing and hygiene a quick tendon washer shower daily to clean the wound site. Maintain either Neosporin are cocoa butter or cream to the incision area as desired. Next  Maintain rollabout for one more week. Starting next week later discontinue rollabout and begin ambulating with the air fracture boot in place.

## 2014-09-06 NOTE — Progress Notes (Signed)
   Subjective:    Patient ID: Claudia Andrade, female    DOB: Jul 20, 1966, 49 y.o.   MRN: [id number]  HPI date of surgery 08/17/2014. Patient is 3 weeks status post retrocalcaneal spur resection into the lysis Achilles tendon. Presents this time for removal remaining skin staples    Review of Systems no new findings or systemic changes noted    Objective:   Physical Exam  neurovascular status is intact pedal pulses are palpable remaining skin staples are removed from the posterior left heel. Sedation is well coapted slight ecchymosis is noted slight edema consistent with postop course minimal pain or discomfort good range of motion dorsal flexion plantar flexion of the ankle Achilles tendon is functional and intact. Patient is still nonweightbearing and using a rollabout. At this time patient is placed in an anklet for compression may resume normal bathing and hygiene starting tomorrow and did recommend utilizing a cocoa butter Neosporin or middermal for skin scar treatment        Assessment & Plan:   assessment good postop progress 3 weeks status post trochanteric nail spur resection into the lysis Achilles tendon at this time reappointed 3 weeks for further follow-up 1 more week of nonweightbearing in 2 weeks of weightbearing with the air fracture boot and compression stocking. Skin care bathing and hygiene and keep a cocoa butter to the incision area to act as any changes or exacerbations occur at any time at 3 weeks follow-up x-ray will be taken and if doing well maybe begin to return to conventional walking shoes within a short time.    Alvan Dameichard Xena Propst DPM

## 2014-09-27 ENCOUNTER — Ambulatory Visit (INDEPENDENT_AMBULATORY_CARE_PROVIDER_SITE_OTHER): Payer: Managed Care, Other (non HMO)

## 2014-09-27 VITALS — BP 110/77 | HR 94 | Resp 12

## 2014-09-27 DIAGNOSIS — M7730 Calcaneal spur, unspecified foot: Secondary | ICD-10-CM

## 2014-09-27 DIAGNOSIS — M7752 Other enthesopathy of left foot: Secondary | ICD-10-CM

## 2014-09-27 DIAGNOSIS — M7662 Achilles tendinitis, left leg: Secondary | ICD-10-CM

## 2014-09-27 DIAGNOSIS — Z09 Encounter for follow-up examination after completed treatment for conditions other than malignant neoplasm: Secondary | ICD-10-CM

## 2014-09-27 NOTE — Progress Notes (Signed)
   Subjective:    Patient ID: Claudia Andrade, female    DOB: Jun 21, 1966, 49 y.o.   MRN: [id number]  HPI  DOS 08/17/14 TENOLYSIS LT, CALCANEAL OSTECTOMY LT.  ''LT BACK OF THE HEEL STILL PAINFUL.''  Review of Systems no new findings or systemic changes noted     Objective:   Physical Exam Patient presents this time 6 week status post to the lysis Achilles tendon retrocalcaneal spur resection of the left heel doing well some slight eschar tissue is debrided away the incision is otherwise well coapted. Patient still having some tenderness and weakness in the Achilles tendon and significant swelling noted clinically and radiographically. The tendon is intact muscle strength intact and doing well some tenderness on dorsal flexion plantar flexion and on resisted plantarflexion. With postop course. He shouldn't was scheduled return to work on the 23rd however she does have to stand and walk all day long likely not ready for that based on progress today with the amount of swelling suggested an additional 2 weeks extension plan to return to work April 6 with no restrictions regular duties. Again wounds well coapted no signs of infection no dehiscence no discharge drainage x-rays reveal intact bone anchors patient will continue with range of motion exercises of dorsal flexion plantar flexion massage and compression stockings may start removing the boot and return to walking tennis shoes beginning today to start with a 2 hour interval without the boot increasing by 2 hours daily until completely out of the boot, by the end of the week.       Assessment & Plan:  Assessment good postoperative progress patient is 6 week status post phenol lysis Achilles tendon and retrocalcaneal spur resection has been weightbearing for about a week now with the air fracture boot incision well coapted may discontinue boot starting this week and resume walking tennis or athletic shoe will extend were clearly for additional 2 weeks 4  weeks from now return to work activities without restrictions reappoint 6 weeks for long-term follow-up with x-rays avoid any ballistic activities continue with warm compress ice pack and massage therapy and compression stockings  Alvan Dameichard Nasiyah Laverdiere DPM

## 2014-09-27 NOTE — Patient Instructions (Signed)
ICE INSTRUCTIONS  Apply ice or cold pack to the affected area at least 3 times a day for 10-15 minutes each time.  You should also use ice after prolonged activity or vigorous exercise.  Do not apply ice longer than 20 minutes at one time.  Always keep a cloth between your skin and the ice pack to prevent burns.  Being consistent and following these instructions will help control your symptoms.  We suggest you purchase a gel ice pack because they are reusable and do bit leak.  Some of them are designed to wrap around the area.  Use the method that works best for you.  Here are some other suggestions for icing.   Use a frozen bag of peas or corn-inexpensive and molds well to your body, usually stays frozen for 10 to 20 minutes.  Wet a towel with cold water and squeeze out the excess until it's damp.  Place in a bag in the freezer for 20 minutes. Then remove and use.   Alternate applying warm compress and ice pack to back heel 2 or 3 times every day also massage the Achilles tendon and maintain compression stocking to help reduce the swelling. Beginning this week start weaning out of the boot back into a comfortable walking tennis or athletic shoe. No ballistic activities avoid any inclines or hills or unsteady or uneven surfaces

## 2014-10-28 ENCOUNTER — Telehealth: Payer: Self-pay | Admitting: *Deleted

## 2014-10-28 MED ORDER — MELOXICAM 15 MG PO TABS: 15.0000 mg | ORAL_TABLET | Freq: Every day | ORAL | Status: AC

## 2014-10-28 NOTE — Telephone Encounter (Signed)
Pt states she has started back to work and now has some pain and swelling, request antiinflammatory medication.  Dr. Ardelle AntonWagoner ordered Meloxicam 15 mg #30 one tablet daily with 1 refill.

## 2014-11-02 ENCOUNTER — Ambulatory Visit (INDEPENDENT_AMBULATORY_CARE_PROVIDER_SITE_OTHER): Payer: Managed Care, Other (non HMO)

## 2014-11-02 ENCOUNTER — Encounter: Payer: Self-pay | Admitting: Podiatry

## 2014-11-02 ENCOUNTER — Ambulatory Visit (INDEPENDENT_AMBULATORY_CARE_PROVIDER_SITE_OTHER): Payer: Managed Care, Other (non HMO) | Admitting: Podiatry

## 2014-11-02 VITALS — BP 129/79 | HR 74 | Resp 18

## 2014-11-02 DIAGNOSIS — M7662 Achilles tendinitis, left leg: Secondary | ICD-10-CM

## 2014-11-02 DIAGNOSIS — Z09 Encounter for follow-up examination after completed treatment for conditions other than malignant neoplasm: Secondary | ICD-10-CM

## 2014-11-03 NOTE — Progress Notes (Signed)
Patient ID: Claudia Andrade, female   DOB: 07-23-1965, 49 y.o.   MRN: [id number]  Subjective: [age] year old female presents the office today status post left Achilles tenolysis and retrocalcaneal spur resection. Since last appointment she is continuing with regular shoe gear. She does that she has some pain overlying the area she's been on it more however she does think that her pain is decreasing. She states that she has had some mild swelling around the surgical site the has had no redness or any increase in warmth. She denies any systemic complaints such as fevers, chills, nausea, vomiting. Denies any calf pain, chest pain, shortness of breath. She is also been taking meloxicam as needed. No other complaints at this time in no acute changes.  Objective: AAO 3, NAD DP/PT pulses palpable, CRT less than 3 seconds Protective sensation intact with Simms Weinstein monofilament, vibratory sensation intact, Achilles tendon reflex intact. Incision on the posterior aspect of the distal Achilles tendon is well coapted without any evidence of dehiscence. There is no overlying erythema or increase in warmth. There is mild edema overlying the posterior inferior aspect of the Achilles tendon along the insertion into the calcaneus. There is mild hypertrophy of the Achilles tendon distally. There is mild discomfort overlying this area. There is no defect noted. Thompson test was performed of the Achilles tendon is intact. No other areas of tenderness to bilateral lower extremities. No other areas of edema, erythema, increase in warmth. No open lesions or pre-ulcer lesions identified. No pain with calf compression, swelling, warmth, erythema.  Assessment: 49 year old female status post left Achilles tenolysis and retrocalcaneal exostectomy by Dr. Ralene CorkSikora on 08-17-14.  Plan: -X-rays were obtained and reviewed with the patient -Treatment options discussed including alternatives, risks, competitions. -At this time recommend  continue ice and elevation particularly in the day. Also discussed with her rehabilitation exercises and stretching exercises to help as well.  -Meloxicam as needed. Discussed side effects the medication to stop if any are to occur and call the office. -Continue supportive shoe gear. Hold off on any high impact activities. -Follow-up in 4 weeks or sooner if any problems are to arise. At that time if symptoms persist discussed possible EPAT or PT.

## 2014-11-08 ENCOUNTER — Ambulatory Visit: Payer: Managed Care, Other (non HMO)

## 2014-11-09 ENCOUNTER — Ambulatory Visit: Payer: Managed Care, Other (non HMO) | Admitting: Podiatry

## 2014-11-23 ENCOUNTER — Ambulatory Visit (INDEPENDENT_AMBULATORY_CARE_PROVIDER_SITE_OTHER): Payer: Managed Care, Other (non HMO)

## 2014-11-23 ENCOUNTER — Ambulatory Visit (INDEPENDENT_AMBULATORY_CARE_PROVIDER_SITE_OTHER): Payer: Managed Care, Other (non HMO) | Admitting: Podiatry

## 2014-11-23 ENCOUNTER — Encounter: Payer: Self-pay | Admitting: Podiatry

## 2014-11-23 VITALS — BP 160/78 | HR 72 | Resp 12

## 2014-11-23 DIAGNOSIS — Z9889 Other specified postprocedural states: Secondary | ICD-10-CM

## 2014-11-23 MED ORDER — DICLOFENAC SODIUM 1 % TD GEL: 2.0000 g | Freq: Four times a day (QID) | TRANSDERMAL | Status: AC

## 2014-11-23 NOTE — Progress Notes (Signed)
Patient ID: Claudia Andrade, female   DOB: 10/12/1965, 89Rudene Anda9 y.o.   MRN: [id number]  Subjective: Claudia Andrade presents the office today status post left Achilles tenolysis and retrocalcaneal spur resection. She states that his last appointment she is doing better. She continues to the meloxicam as needed she's been doing stretching, icing activities. She gets some intermittent discomfort when working for long periods of time however her pain is significantly improved.She denies any systemic complaints such as fevers, chills, nausea, vomiting. Denies any calf pain, chest pain, shortness of breath. No other complaints at this time in no acute changes.  Objective: AAO 3, NAD DP/PT pulses palpable, CRT less than 3 seconds Protective sensation intact with Simms Weinstein monofilament, vibratory sensation intact, Achilles tendon reflex intact. Incision on the posterior aspect of the distal Achilles tendon is well coapted without any evidence of dehiscence. There is no overlying erythema or increase in warmth. There is no edema overlying the posterior inferior aspect of the Achilles tendon along the insertion into the calcaneus. There is mild hypertrophy of the Achilles tendon distally although improved. There is no discomfort overlying this area. There is no defect noted. Thompson test was performed of the Achilles tendon is intact. No other areas of tenderness to bilateral lower extremities. No other areas of edema, erythema, increase in warmth. No open lesions or pre-ulcer lesions identified. No pain with calf compression, swelling, warmth, erythema.  Assessment: 49 year old female status post left Achilles tenolysis and retrocalcaneal exostectomy by Dr. Ralene CorkSikora on 08-17-14.  Plan: -X-rays were obtained and reviewed with the patient -Treatment options discussed including alternatives, risks, competitions. -Continue stretching/icing -Prescribed voltaren gel as needed. -Continue daily activities as tolerated.   -Follow-up in 6 weeks or sooner if any problems are to arise. In the meantime, encouraged to call with any questions/concerns/change in symptoms.

## 2014-12-28 ENCOUNTER — Other Ambulatory Visit: Payer: Self-pay | Admitting: Podiatry

## 2015-01-04 ENCOUNTER — Encounter: Payer: Managed Care, Other (non HMO) | Admitting: Podiatry

## 2016-09-26 ENCOUNTER — Emergency Department (HOSPITAL_COMMUNITY)
Admission: EM | Admit: 2016-09-26 | Discharge: 2016-09-27 | Disposition: A | Discharge status: Home / Self Care | Payer: Managed Care, Other (non HMO) | Attending: Emergency Medicine | Admitting: Emergency Medicine

## 2016-09-26 ENCOUNTER — Encounter (HOSPITAL_COMMUNITY): Payer: Self-pay

## 2016-09-26 DIAGNOSIS — Z79899 Other long term (current) drug therapy: Secondary | ICD-10-CM | POA: Insufficient documentation

## 2016-09-26 DIAGNOSIS — R109 Unspecified abdominal pain: Secondary | ICD-10-CM | POA: Diagnosis present

## 2016-09-26 DIAGNOSIS — E039 Hypothyroidism, unspecified: Secondary | ICD-10-CM | POA: Insufficient documentation

## 2016-09-26 DIAGNOSIS — R252 Cramp and spasm: Secondary | ICD-10-CM

## 2016-09-26 LAB — COMPREHENSIVE METABOLIC PANEL
ALBUMIN: 4.3 g/dL (ref 3.5–5.0)
ALT: 21 U/L (ref 14–54)
ANION GAP: 7 (ref 5–15)
AST: 19 U/L (ref 15–41)
Alkaline Phosphatase: 66 U/L (ref 38–126)
BILIRUBIN TOTAL: 0.6 mg/dL (ref 0.3–1.2)
BUN: 16 mg/dL (ref 6–20)
CHLORIDE: 106 mmol/L (ref 101–111)
CO2: 26 mmol/L (ref 22–32)
Calcium: 9.5 mg/dL (ref 8.9–10.3)
Creatinine, Ser: 1.1 mg/dL — ABNORMAL HIGH (ref 0.44–1.00)
GFR calc Af Amer: >60 mL/min (ref >60)
GFR, EST NON AFRICAN AMERICAN: 57 mL/min — AB (ref >60)
GLUCOSE: 95 mg/dL (ref 65–99)
POTASSIUM: 3.8 mmol/L (ref 3.5–5.1)
Sodium: 139 mmol/L (ref 135–145)
TOTAL PROTEIN: 7.6 g/dL (ref 6.5–8.1)

## 2016-09-26 LAB — URINALYSIS, ROUTINE W REFLEX MICROSCOPIC
Bacteria, UA: NONE SEEN
Bilirubin Urine: NEGATIVE
GLUCOSE, UA: NEGATIVE mg/dL
HGB URINE DIPSTICK: NEGATIVE
KETONES UR: NEGATIVE mg/dL
Nitrite: NEGATIVE
PROTEIN: NEGATIVE mg/dL
Specific Gravity, Urine: 1.005 (ref 1.005–1.030)
Squamous Epithelial / LPF: NONE SEEN
pH: 6 (ref 5.0–8.0)

## 2016-09-26 LAB — CBC WITH DIFFERENTIAL/PLATELET
BASOS ABS: 0.1 10*3/uL (ref 0.0–0.1)
BASOS PCT: 1 %
EOS ABS: 0.4 10*3/uL (ref 0.0–0.7)
EOS PCT: 4 %
HEMATOCRIT: 38.5 % (ref 36.0–46.0)
Hemoglobin: 12.9 g/dL (ref 12.0–15.0)
Lymphocytes Relative: 42 %
Lymphs Abs: 3.4 10*3/uL (ref 0.7–4.0)
MCH: 28.4 pg (ref 26.0–34.0)
MCHC: 33.5 g/dL (ref 30.0–36.0)
MCV: 84.6 fL (ref 78.0–100.0)
MONO ABS: 0.6 10*3/uL (ref 0.1–1.0)
MONOS PCT: 8 %
NEUTROS ABS: 3.8 10*3/uL (ref 1.7–7.7)
Neutrophils Relative %: 45 %
PLATELETS: 290 10*3/uL (ref 150–400)
RBC: 4.55 MIL/uL (ref 3.87–5.11)
RDW: 13.4 % (ref 11.5–15.5)
WBC: 8.2 10*3/uL (ref 4.0–10.5)

## 2016-09-26 LAB — LIPASE, BLOOD: LIPASE: 33 U/L (ref 11–51)

## 2016-09-26 MED ORDER — SODIUM CHLORIDE 0.9 % IV BOLUS (SEPSIS)
1000.0000 mL | Freq: Once | INTRAVENOUS | Status: AC
  Administered 2016-09-26: 1000 mL via INTRAVENOUS

## 2016-09-26 MED ORDER — KETOROLAC TROMETHAMINE 30 MG/ML IJ SOLN
30.0000 mg | Freq: Once | INTRAMUSCULAR | Status: AC
  Administered 2016-09-26: 30 mg via INTRAVENOUS
  Filled 2016-09-26: qty 1

## 2016-09-26 NOTE — ED Notes (Signed)
Pt has been intermittent c/o right flank pain since Tuesday night. Pt has had indigestion. Denies vomiting and diarrhea. Pain is worsened with activity. Denies urinary symptoms.

## 2016-09-27 MED ORDER — CYCLOBENZAPRINE HCL 10 MG PO TABS: 10.0000 mg | ORAL_TABLET | Freq: Two times a day (BID) | ORAL | 0 refills | Status: AC | PRN

## 2016-09-27 NOTE — ED Provider Notes (Signed)
[facility]-EMERGENCY DEPT Provider Note   CSN: [id number] Arrival date & time: 09/26/16  1927     History   Chief Complaint Chief Complaint  Patient presents with  . Flank Pain    HPI Claudia Andrade is a 51 y.o. female.  HPI  51yo female presents with concern for intermittent right flank pain for two days. Begins in right flank and radiates to RUQ.  Pain is sharp and severe then subsides completely.  It is worse with small movements, like turning over in bed. No chest pain, no shortness of breath, no fever, no nausea/vomiting, no urinary or pelvic symptoms.   Past Medical History:  Diagnosis Date  . Anxiety   . Hypothyroid   . Lower back pain     There are no active problems to display for this patient.   History reviewed. No pertinent surgical history.  OB History    No data available       Home Medications    Prior to Admission medications   Medication Sig Start Date End Date Taking? Authorizing Provider  acetaminophen (TYLENOL) 500 MG tablet Take 1,000 mg by mouth every 6 (six) hours as needed for mild pain.   Yes Historical Provider, MD  escitalopram (LEXAPRO) 20 MG tablet Take 20 mg by mouth daily.   Yes Historical Provider, MD  fluticasone (FLONASE) 50 MCG/ACT nasal spray Place 1 spray into both nostrils daily.   Yes Historical Provider, MD  levothyroxine (SYNTHROID, LEVOTHROID) 100 MCG tablet Take 100 mcg by mouth daily before breakfast.   Yes Historical Provider, MD  cyclobenzaprine (FLEXERIL) 10 MG tablet Take 1 tablet (10 mg total) by mouth 2 (two) times daily as needed for muscle spasms. 09/27/16   Alvira MondayErin Nilani Hugill, MD  diclofenac sodium (VOLTAREN) 1 % GEL Apply 2 g topically 4 (four) times daily. Rub into affected area of foot 2 to 4 times daily Patient not taking: Reported on 09/26/2016 11/23/14   Vivi BarrackMatthew R Wagoner, DPM  meloxicam (MOBIC) 15 MG tablet Take 1 tablet (15 mg total) by mouth daily. Patient not taking: Reported on 09/26/2016 10/28/14   Vivi BarrackMatthew R Wagoner, DPM    meperidine (DEMEROL) 50 MG tablet Take 1 tablet (50 mg total) by mouth every 4 (four) hours as needed for severe pain. Patient not taking: Reported on 09/26/2016 08/18/14   Lenn SinkNorman S Regal, DPM  promethazine (PHENERGAN) 25 MG tablet Take 1 tablet (25 mg total) by mouth every 8 (eight) hours as needed for nausea or vomiting. Patient not taking: Reported on 09/26/2016 08/18/14   Lenn SinkNorman S Regal, DPM    Family History No family history on file.  Social History Social History  Substance Use Topics  . Smoking status: Never Smoker  . Smokeless tobacco: Not on file  . Alcohol use Not on file     Allergies   Morphine and related and Oxycodone-acetaminophen   Review of Systems Review of Systems  Constitutional: Negative for appetite change and fever.  Respiratory: Negative for cough and shortness of breath.   Cardiovascular: Negative for chest pain.  Gastrointestinal: Positive for abdominal pain. Negative for constipation, diarrhea, nausea and vomiting.  Genitourinary: Positive for flank pain. Negative for difficulty urinating, dysuria, vaginal bleeding and vaginal discharge.  Musculoskeletal: Positive for back pain. Negative for neck pain.  Skin: Negative for rash.  Neurological: Negative for syncope and headaches.     Physical Exam Updated Vital Signs BP 115/76 (BP Location: Left Arm)   Pulse 62   Temp 98.2 F (36.8  C) (Oral)   Resp 16   Ht 5\' 2"  (1.575 m)   Wt 195 lb (88.5 kg)   SpO2 100%   BMI 35.67 kg/m   Physical Exam  Constitutional: She is oriented to person, place, and time. She appears well-developed and well-nourished. No distress.  HENT:  Head: Normocephalic and atraumatic.  Eyes: Conjunctivae and EOM are normal.  Neck: Normal range of motion.  Cardiovascular: Normal rate, regular rhythm, normal heart sounds and intact distal pulses.  Exam reveals no gallop and no friction rub.   No murmur heard. Pulmonary/Chest: Effort normal and breath sounds normal. No  respiratory distress. She has no wheezes. She has no rales.  Abdominal: Soft. She exhibits no distension. There is tenderness (mild RUQ). There is no guarding.  Musculoskeletal: She exhibits no edema or tenderness.  Neurological: She is alert and oriented to person, place, and time.  Skin: Skin is warm and dry. No rash noted. She is not diaphoretic. No erythema.  Nursing note and vitals reviewed.    ED Treatments / Results  Labs (all labs ordered are listed, but only abnormal results are displayed) Labs Reviewed  URINALYSIS, ROUTINE W REFLEX MICROSCOPIC - Abnormal; Notable for the following:       Result Value   Color, Urine STRAW (*)    Leukocytes, UA TRACE (*)    All other components within normal limits  COMPREHENSIVE METABOLIC PANEL - Abnormal; Notable for the following:    Creatinine, Ser 1.10 (*)    GFR calc non Af Amer 57 (*)    All other components within normal limits  CBC WITH DIFFERENTIAL/PLATELET  LIPASE, BLOOD    EKG  EKG Interpretation None       Radiology No results found.  Procedures Procedures (including critical care time)  Medications Ordered in ED Medications  sodium chloride 0.9 % bolus 1,000 mL (0 mLs Intravenous Stopped 09/26/16 2305)  ketorolac (TORADOL) 30 MG/ML injection 30 mg (30 mg Intravenous Given 09/26/16 2200)     Initial Impression / Assessment and Plan / ED Course  I have reviewed the triage vital signs and the nursing notes.  Pertinent labs & imaging results that were available during my care of the patient were reviewed by me and considered in my medical decision making (see chart for details).    51yo female with history of cholecystectomy presents with concern for 2 days of right flank and RUQ abdominal pain intermittently.  Given hx, no current pain, doubt nephrolithiasis/renal infarct. No sign of choledocolithiasis. Pt has had cholecystectomy.  Location/hx not consistent with appendicitis/TOA/torsion. Doubt cardiac etiology given  no CP/dyspnea, location not consistent, not exertional pain but rather worse with movements. No dyspnea nor PE risk factors and doubt PE.    Given IV fluids and toradol in ED. Given pain worse with movements, suspect muscle strain and spasm, with other possible etiologies including nerve related from back pathology.  Given rx for flexeril.  Patient discharged in stable condition with understanding of reasons to return.   Final Clinical Impressions(s) / ED Diagnoses   Final diagnoses:  Right flank pain  Muscle cramp, suspect muscular strain or possible thoracic disc disease    New Prescriptions Discharge Medication List as of 09/27/2016 12:22 AM    START taking these medications   Details  cyclobenzaprine (FLEXERIL) 10 MG tablet Take 1 tablet (10 mg total) by mouth 2 (two) times daily as needed for muscle spasms., Starting Fri 09/27/2016, Print  Alvira Monday, MD 09/27/16 [phone]

## 2016-09-27 NOTE — ED Notes (Signed)
Pt very upset about not being seen by physician since being brought back to ED room. Pt requested to have IV taken out and stated she was ready to go home because she has been in the room for 3 hours and is tired of waiting to be seen. IV taken out per pt request.

## 2021-11-06 ENCOUNTER — Ambulatory Visit: Payer: Managed Care, Other (non HMO) | Admitting: Physician Assistant
